# Patient Record
Sex: Female | Born: 1965 | Hispanic: Yes | Marital: Single | Smoking: Never smoker
Health system: Southern US, Community
[De-identification: ages and names within clinical notes are randomized; demographics above are authoritative.]

## PROBLEM LIST (undated history)

## (undated) HISTORY — PX: BREAST CYST ASPIRATION: SHX578

---

## 2012-03-25 ENCOUNTER — Ambulatory Visit: Payer: Self-pay | Admitting: Family Medicine

## 2013-04-07 ENCOUNTER — Ambulatory Visit: Payer: Self-pay

## 2013-05-07 ENCOUNTER — Ambulatory Visit: Payer: Self-pay

## 2014-06-15 ENCOUNTER — Ambulatory Visit: Payer: Self-pay

## 2016-02-12 ENCOUNTER — Encounter: Payer: Self-pay | Admitting: *Deleted

## 2016-02-12 ENCOUNTER — Ambulatory Visit: Payer: Self-pay | Attending: Internal Medicine | Admitting: *Deleted

## 2016-02-12 ENCOUNTER — Ambulatory Visit
Admission: RE | Admit: 2016-02-12 | Discharge: 2016-02-12 | Disposition: A | Discharge status: Home / Self Care | Payer: Self-pay | Source: Ambulatory Visit | Attending: Internal Medicine | Admitting: Internal Medicine

## 2016-02-12 ENCOUNTER — Encounter (INDEPENDENT_AMBULATORY_CARE_PROVIDER_SITE_OTHER): Payer: Self-pay

## 2016-02-12 VITALS — BP 130/80 | HR 73 | Temp 98.7°F | Ht 62.6 in | Wt 181.2 lb

## 2016-02-12 DIAGNOSIS — Z0000 Encounter for general adult medical examination without abnormal findings: Secondary | ICD-10-CM

## 2016-02-12 NOTE — Progress Notes (Signed)
Subjective:     Patient ID: Diana Cohen, female   DOB: 10/17/1965, 50 y.o.   MRN: [id number]  HPI   Review of Systems     Objective:   Physical Exam  Pulmonary/Chest: Right breast exhibits no inverted nipple, no mass, no nipple discharge, no skin change and no tenderness. Left breast exhibits no inverted nipple, no mass, no nipple discharge, no skin change and no tenderness. Breasts are symmetrical.  Bilateral nipples with a transverse slit       Assessment:     50 year old Hispanic [facility].  Diana OrmondSany Cohen the interpreter via language line was used during the interview with Diana Cohen, and Diana Cohen, the [facility] interpreter was present during the exam.  Clinical breast exam unremarkable.  Taught self breast awareness.  Patient just had pap with her PCP, Diana Cohen in August of this year.  Patient has been screened for eligibility.  She does not have any insurance, Medicare or Medicaid.  She also meets financial eligibility.  Hand-out given on the Affordable Care Act.    Plan:     Screening mammogram ordered.   Will follow-up per [facility] protocol.

## 2016-02-12 NOTE — Patient Instructions (Signed)
Gave patient hand-out, Women Staying Healthy, Active and Well from [facility], with education on breast health, pap smears, heart and colon health. 

## 2016-02-13 ENCOUNTER — Encounter: Payer: Self-pay | Admitting: *Deleted

## 2016-02-13 NOTE — Progress Notes (Signed)
Letter mailed to inform patient of her normal mammogram.  She is to follow-up in one year with annual screening.  HSIS to [name]. 

## 2018-03-18 ENCOUNTER — Encounter (INDEPENDENT_AMBULATORY_CARE_PROVIDER_SITE_OTHER): Payer: Self-pay

## 2018-03-18 ENCOUNTER — Ambulatory Visit
Admission: RE | Admit: 2018-03-18 | Discharge: 2018-03-18 | Disposition: A | Discharge status: Home / Self Care | Payer: Self-pay | Source: Ambulatory Visit | Attending: Oncology | Admitting: Oncology

## 2018-03-18 ENCOUNTER — Ambulatory Visit: Payer: Self-pay | Attending: Oncology

## 2018-03-18 VITALS — BP 146/78 | HR 90 | Temp 96.5°F | Resp 18 | Ht 63.0 in | Wt 193.0 lb

## 2018-03-18 DIAGNOSIS — Z0000 Encounter for general adult medical examination without abnormal findings: Secondary | ICD-10-CM

## 2018-03-18 NOTE — Progress Notes (Signed)
  Subjective:     Patient ID: Carolene Gitto, female   DOB: 02/23/1966, 52 y.o.   MRN: [id number]  HPI   Review of Systems     Objective:   Physical Exam  Pulmonary/Chest: Right breast exhibits no inverted nipple, no mass, no nipple discharge, no skin change and no tenderness. Left breast exhibits no inverted nipple, no mass, no nipple discharge, no skin change and no tenderness. Breasts are asymmetrical.  Right breast larger than left.  Transverse nipple inversion       Assessment:     52 year old hispanic patient presents for [facility] clinic visit.  Delos Haring interpreted exam. Patient screened, and meets [facility] eligibility.  Patient does not have insurance, Medicare or Medicaid. Instructed patient on breast self awareness using teach back method.  Clinical breast exam unremarkable. States she has intermittent.  History of right breast cyst aspiration per patient.  Handout given on Affordable Care Act.      Plan:     Sent for bilateral screening mammogram.

## 2018-03-25 NOTE — Progress Notes (Signed)
Letter mailed from [facility] to notify of normal mammogram results.  Patient to return in one year for annual screening.  Copy to HSIS. 

## 2019-09-10 ENCOUNTER — Ambulatory Visit: Payer: Self-pay | Attending: Internal Medicine

## 2020-02-11 ENCOUNTER — Emergency Department: Payer: HRSA Program

## 2020-02-11 ENCOUNTER — Encounter: Payer: Self-pay | Admitting: Emergency Medicine

## 2020-02-11 ENCOUNTER — Emergency Department
Admission: EM | Admit: 2020-02-11 | Discharge: 2020-02-11 | Disposition: A | Discharge status: Home / Self Care | Payer: HRSA Program | Attending: Emergency Medicine | Admitting: Emergency Medicine

## 2020-02-11 ENCOUNTER — Other Ambulatory Visit: Payer: Self-pay

## 2020-02-11 DIAGNOSIS — J189 Pneumonia, unspecified organism: Secondary | ICD-10-CM

## 2020-02-11 DIAGNOSIS — U071 COVID-19: Secondary | ICD-10-CM | POA: Diagnosis not present

## 2020-02-11 DIAGNOSIS — R509 Fever, unspecified: Secondary | ICD-10-CM | POA: Diagnosis present

## 2020-02-11 DIAGNOSIS — Z1152 Encounter for screening for COVID-19: Secondary | ICD-10-CM

## 2020-02-11 DIAGNOSIS — J1282 Pneumonia due to coronavirus disease 2019: Secondary | ICD-10-CM | POA: Insufficient documentation

## 2020-02-11 LAB — SARS CORONAVIRUS 2 BY RT PCR (HOSPITAL ORDER, PERFORMED IN ~~LOC~~ HOSPITAL LAB): SARS Coronavirus 2: POSITIVE — AB

## 2020-02-11 MED ORDER — PREDNISONE 10 MG PO TABS: ORAL_TABLET | ORAL | 0 refills | Status: AC

## 2020-02-11 MED ORDER — ACETAMINOPHEN 500 MG PO TABS
1000.0000 mg | ORAL_TABLET | Freq: Once | ORAL | Status: AC
  Administered 2020-02-11: 1000 mg via ORAL
  Filled 2020-02-11: qty 2

## 2020-02-11 NOTE — ED Triage Notes (Signed)
Says fever for several days and cough.  Not getting better.

## 2020-02-11 NOTE — ED Notes (Signed)
See triage note  Presents with worsening cough and SOB  States she has had negative COIVD at PCP  Then had test repeated on weds

## 2020-02-11 NOTE — ED Provider Notes (Signed)
[facility] Emergency Department Provider Note  ____________________________________________   First MD Initiated Contact with Patient 02/11/20 [phone]     (approximate)  I have reviewed the triage vital signs and the nursing notes.   HISTORY  Chief Complaint Fever Spanish interpreter multiple times to obtain history  HPI Diana Cohen is a 54 y.o. female presents to the ED with complaint of cough that has gotten worse since last week.  Patient reports subjective fever and chills.  She has been taking over-the-counter medication without any relief.  She denies being a smoker.  Patient reports to the interpreter that she had a Covid test done at [facility] which was negative.  No one else in her family is sick at this time.  Patient denies getting Covid vaccine.      History reviewed. No pertinent past medical history.  There are no problems to display for this patient.   History reviewed. No pertinent surgical history.  Prior to Admission medications   Medication Sig Start Date End Date Taking? Authorizing Provider  predniSONE (DELTASONE) 10 MG tablet Take 6 tablets  today, on day 2 take 5 tablets, day 3 take 4 tablets, day 4 take 3 tablets, day 5 take  2 tablets and 1 tablet the last day 02/11/20   Tommi Rumps, PA-C    Allergies Patient has no known allergies.  Family History  Problem Relation Age of Onset   Breast cancer Neg Hx     Social History Social History   Tobacco Use   Smoking status: Never Smoker   Smokeless tobacco: Never Used  Substance Use Topics   Alcohol use: Never   Drug use: Not on file    Review of Systems Constitutional: Subjective fever/chills Eyes: No visual changes. ENT: No sore throat. Cardiovascular: Denies chest pain. Respiratory: Denies shortness of breath. Gastrointestinal: No abdominal pain.  No nausea, no vomiting.  No diarrhea.   Genitourinary: Negative for  dysuria. Musculoskeletal: Positive for generalized muscle aches. Skin: Negative for rash. Neurological: Positive for headache, no focal weakness or numbness.  ____________________________________________   PHYSICAL EXAM:  VITAL SIGNS: ED Triage Vitals  Enc Vitals Group     BP 02/11/20 0744 (!) 160/83     Pulse Rate 02/11/20 0744 85     Resp 02/11/20 0744 20     Temp 02/11/20 0744 98.9 F (37.2 C)     Temp Source 02/11/20 0744 Oral     SpO2 02/11/20 0744 95 %     Weight --      Height --      Head Circumference --      Peak Flow --      Pain Score 02/11/20 0739 0     Pain Loc --      Pain Edu? --      Excl. in GC? --     Constitutional: Alert and oriented. Well appearing and in no acute distress. Eyes: Conjunctivae are normal.  Head: Atraumatic. Nose: No congestion/rhinnorhea. Neck: No stridor.   Cardiovascular: Normal rate, regular rhythm. Grossly normal heart sounds.  Good peripheral circulation. Respiratory: Normal respiratory effort.  No retractions. Lungs CTAB. Gastrointestinal: Soft and nontender. No distention.  Sounds normoactive x4 quadrants. Musculoskeletal: Moves upper and lower extremities with any difficulty.  Normal gait was noted. Neurologic:  Normal speech and language. No gross focal neurologic deficits are appreciated. No gait instability. Skin:  Skin is warm, dry and intact. No rash noted. Psychiatric: Mood and affect  are normal. Speech and behavior are normal.  ____________________________________________   LABS (all labs ordered are listed, but only abnormal results are displayed)  Labs Reviewed  SARS CORONAVIRUS 2 BY RT PCR (HOSPITAL ORDER, PERFORMED IN [facility]) - Abnormal; Notable for the following components:      Result Value   SARS Coronavirus 2 POSITIVE (*)    All other components within normal limits    RADIOLOGY   Official radiology report(s): DG Chest 2 View  Result Date: 02/11/2020 CLINICAL DATA:  Cough,  shortness of breath EXAM: CHEST - 2 VIEW COMPARISON:  02/11/2020 FINDINGS: Multifocal patchy opacities with a subpleural/peripheral and lower lobe predominance, favoring multifocal atypical/viral pneumonia such as COVID. No pleural effusion or pneumothorax. The heart is normal in size. Visualized osseous structures are within normal limits. IMPRESSION: Multifocal pneumonia, favoring an atypical/viral etiology such as COVID. Electronically Signed   By: Charline Bills M.D.   On: 02/11/2020 09:25   DG Chest Port 1 View  Result Date: 02/11/2020 CLINICAL DATA:  Cough for 10 days.  Shortness of breath.  Nonsmoker. EXAM: PORTABLE CHEST 1 VIEW COMPARISON:  None. FINDINGS: Midline trachea. Normal heart size for level of inspiration. No pleural effusion or pneumothorax. Extremely low lung volumes. Subtle patchy basilar opacities are suspected. IMPRESSION: Low lung volume portable radiograph. Patchy basilar opacities are suspected. Although these could represent atelectasis, infection, including COVID-19 pneumonia could have a similar appearance. Recommend PA and lateral radiographs if feasible. Electronically Signed   By: Jeronimo Greaves M.D.   On: 02/11/2020 08:53    ____________________________________________   PROCEDURES  Procedure(s) performed (including Critical Care):  Procedures   ____________________________________________   INITIAL IMPRESSION / ASSESSMENT AND PLAN / ED COURSE  As part of my medical decision making, I reviewed the following data within the electronic MEDICAL RECORD NUMBER Notes from prior ED visits and [place] Controlled Substance Database  Diana Cohen was evaluated in Emergency Department on 02/11/2020 for the symptoms described in the history of present illness. She was evaluated in the context of the global COVID-19 pandemic, which necessitated consideration that the patient might be at risk for infection with the SARS-CoV-2 virus that causes COVID-19. Institutional  protocols and algorithms that pertain to the evaluation of patients at risk for COVID-19 are in a state of rapid change based on information released by regulatory bodies including the CDC and federal and state organizations. These policies and algorithms were followed during the patient's care in the ED.  54 year old female presents to the ED with complaint of cough and shortness of breath.  She states that she has already been seen at [facility] and had a COVID test that reported as negative.  Patient also had a repeat Covid test done 2 days ago and still waiting for the results of that test.  Patient complains of multiple body aches, fever, chills and headache.  Patient has no prior history of smoking.  This is her 12th day of being sick when asked specifically by the interpreter which day she got sick on.  She reports that her symptoms began on August 16.  Chest x-ray shows multifocal pneumonia.  Patient's O2 sat is 96% and patient is stable.  With the interpreter we discussed treatment plans.  Because this is her 12th day of being sick Dr. Delford Field advised that patient is not eligible for the infusion therapy.  Patient was made aware.  She agrees to begin taking prednisone taper.  She is encouraged to ambulate  frequently and to take deep breaths.  She is aware per Spanish interpreter that she is to return to the emergency department immediately if any severe worsening of her breathing or increased shortness of breath.  Prior to discharge the Covid test that was done at [facility] resulted as positive and patient was made aware. ____________________________________________   FINAL CLINICAL IMPRESSION(S) / ED DIAGNOSES  Final diagnoses:  Multifocal pneumonia  Encounter for screening for COVID-19     ED Discharge Orders         Ordered    predniSONE (DELTASONE) 10 MG tablet        02/11/20 1130           Note:  This document was prepared using Dragon voice recognition software and  may include unintentional dictation errors.    Tommi Rumps, PA-C 02/11/20 1420    Arnaldo Natal, MD 02/11/20 [phone]

## 2020-02-11 NOTE — Discharge Instructions (Addendum)
Return immediately to the ED if any worsening of your breathing or shortness of breath. Begin taking the prednisone as directed.

## 2020-02-12 ENCOUNTER — Telehealth: Payer: Self-pay | Admitting: Nurse Practitioner

## 2020-02-12 NOTE — Telephone Encounter (Signed)
Called to discuss with Debbora Lacrosse about Covid symptoms and the use of casirivimab/imdevimab, a combination monoclonal antibody infusion for those with mild to moderate Covid symptoms and at a high risk of hospitalization.     Pt is qualified for this infusion at the [facility] infusion center due to co-morbid conditions (as indicated below) and/or a member of an at-risk group.  Unable to reach. Voicemail left with interpretor service use.   Willette Alma, AGPCNP-BC

## 2020-04-05 ENCOUNTER — Encounter (INDEPENDENT_AMBULATORY_CARE_PROVIDER_SITE_OTHER): Payer: Self-pay

## 2020-04-05 ENCOUNTER — Other Ambulatory Visit: Payer: Self-pay

## 2020-04-05 ENCOUNTER — Ambulatory Visit: Payer: Self-pay | Attending: Oncology

## 2020-04-05 ENCOUNTER — Ambulatory Visit
Admission: RE | Admit: 2020-04-05 | Discharge: 2020-04-05 | Disposition: A | Discharge status: Home / Self Care | Payer: Self-pay | Source: Ambulatory Visit | Attending: Oncology | Admitting: Oncology

## 2020-04-05 VITALS — BP 144/60 | HR 80 | Temp 98.9°F | Ht 63.16 in | Wt 182.1 lb

## 2020-04-05 DIAGNOSIS — Z0000 Encounter for general adult medical examination without abnormal findings: Secondary | ICD-10-CM

## 2020-04-05 NOTE — Progress Notes (Signed)
  Subjective:     Patient ID: Diana Cohen, female   DOB: 02-Oct-1965, 54 y.o.   MRN: [id number]  HPI   Review of Systems     Objective:   Physical Exam Chest:     Breasts:        Right: No swelling, bleeding, inverted nipple, mass, nipple discharge, skin change or tenderness.        Left: No swelling, bleeding, inverted nipple, mass, nipple discharge, skin change or tenderness.        Assessment:     54 year old Hispanic patient presents for [facility] clinic visit.  Patient screened, and meets [facility] eligibility.  Patient does not have insurance, Medicare or Medicaid. Instructed patient on breast self awareness using teach back method. Clinical breast exam unremarkable.  Risk Assessment    Risk Scores      04/05/2020 03/18/2018   Last edited by: Alta Corning, CMA Lenn Sink, RN   5-year risk: 1.1 % 1 %   Lifetime risk: 9.1 % 9.4 %            Plan:     Sent for bilateral screening mammogram.

## 2020-04-10 NOTE — Progress Notes (Signed)
Letter mailed from [facility] to notify of normal mammogram results.  Patient to return in one year for annual screening.  Copy to HSIS. 

## 2021-04-11 ENCOUNTER — Ambulatory Visit
Admission: RE | Admit: 2021-04-11 | Discharge: 2021-04-11 | Disposition: A | Discharge status: Home / Self Care | Payer: Self-pay | Source: Ambulatory Visit | Attending: Oncology | Admitting: Oncology

## 2021-04-11 ENCOUNTER — Other Ambulatory Visit: Payer: Self-pay

## 2021-04-11 ENCOUNTER — Ambulatory Visit: Payer: Self-pay | Attending: Oncology

## 2021-04-11 DIAGNOSIS — Z0000 Encounter for general adult medical examination without abnormal findings: Secondary | ICD-10-CM

## 2021-04-11 NOTE — Progress Notes (Signed)
Patient pre-screened for [facility] eligibility due to COVID 19 precautions. Two patient identifiers used for verification that I was speaking to correct patient.  Patient to Present directly to [facility] today for [facility] screening mammogram.  °

## 2021-04-29 NOTE — Progress Notes (Signed)
Letter mailed from [facility] to notify of normal mammogram results.  Patient to return in one year for annual screening.  Copy to HSIS. 

## 2022-03-22 ENCOUNTER — Other Ambulatory Visit: Payer: Self-pay

## 2022-03-22 DIAGNOSIS — Z1231 Encounter for screening mammogram for malignant neoplasm of breast: Secondary | ICD-10-CM

## 2022-03-26 ENCOUNTER — Ambulatory Visit: Payer: Self-pay

## 2022-05-06 ENCOUNTER — Ambulatory Visit: Payer: Self-pay | Attending: Hematology and Oncology | Admitting: Hematology and Oncology

## 2022-05-06 ENCOUNTER — Other Ambulatory Visit: Payer: Self-pay

## 2022-05-06 ENCOUNTER — Ambulatory Visit
Admission: RE | Admit: 2022-05-06 | Discharge: 2022-05-06 | Disposition: A | Discharge status: Home / Self Care | Payer: Self-pay | Source: Ambulatory Visit | Attending: Obstetrics and Gynecology | Admitting: Obstetrics and Gynecology

## 2022-05-06 VITALS — BP 162/83 | Wt 189.5 lb

## 2022-05-06 DIAGNOSIS — Z1231 Encounter for screening mammogram for malignant neoplasm of breast: Secondary | ICD-10-CM | POA: Insufficient documentation

## 2022-05-06 DIAGNOSIS — Z01419 Encounter for gynecological examination (general) (routine) without abnormal findings: Secondary | ICD-10-CM

## 2022-05-06 DIAGNOSIS — Z124 Encounter for screening for malignant neoplasm of cervix: Secondary | ICD-10-CM

## 2022-05-06 NOTE — Patient Instructions (Signed)
Taught Diana Cohen about self breast awareness. Patient did need a Pap smear today due to last Pap smear was in 2020 per patient. Let her know [facility] will cover Pap smears every 5 years unless has a history of abnormal Pap smears. Referred [facility] to the [facility] for screening mammogram. Appointment scheduled for 05/06/22. Patient aware of appointment and will be there. Let patient know will follow up with her within the next couple weeks with results. Diana Cohen verbalized understanding.  Pascal Lux, NP 2:01 PM

## 2022-05-06 NOTE — Progress Notes (Signed)
[name] is a 56 y.o. No obstetric history on file. female who presents to [facility] clinic today with no complaints.    Pap Smear: Pap smear completed today. Last Pap smear was 2020 at [facility] clinic and was normal. Per patient has no history of an abnormal Pap smear. Last Pap smear result is not available in Epic.   Physical exam: Breasts Breasts symmetrical. No skin abnormalities bilateral breasts. No nipple retraction bilateral breasts. No nipple discharge bilateral breasts. No lymphadenopathy. No lumps palpated bilateral breasts.     MS DIGITAL SCREENING TOMO BILATERAL  Result Date: 04/13/2021 CLINICAL DATA:  Screening. EXAM: DIGITAL SCREENING BILATERAL MAMMOGRAM WITH TOMOSYNTHESIS AND CAD TECHNIQUE: Bilateral screening digital craniocaudal and mediolateral oblique mammograms were obtained. Bilateral screening digital breast tomosynthesis was performed. The images were evaluated with computer-aided detection. COMPARISON:  Previous exam(s). ACR Breast Density Category b: There are scattered areas of fibroglandular density. FINDINGS: There are no findings suspicious for malignancy. IMPRESSION: No mammographic evidence of malignancy. A result letter of this screening mammogram will be mailed directly to the patient. RECOMMENDATION: Screening mammogram in one year. (Code:SM-B-01Y) BI-RADS CATEGORY  1: Negative. Electronically Signed   By: Bary Richard M.D.   On: 04/13/2021 09:36  MS DIGITAL SCREENING TOMO BILATERAL  Result Date: 04/08/2020 CLINICAL DATA:  Screening. EXAM: DIGITAL SCREENING BILATERAL MAMMOGRAM WITH TOMO AND CAD COMPARISON:  Previous exam(s). ACR Breast Density Category b: There are scattered areas of fibroglandular density. FINDINGS: There are no findings suspicious for malignancy. Images were processed with CAD. IMPRESSION: No mammographic evidence of malignancy. A result letter of this screening mammogram will be mailed directly to the patient. RECOMMENDATION:  Screening mammogram in one year. (Code:SM-B-01Y) BI-RADS CATEGORY  1: Negative. Electronically Signed   By: Frederico Hamman M.D.   On: 04/08/2020 08:31   MS DIGITAL SCREENING TOMO BILATERAL  Result Date: 03/19/2018 CLINICAL DATA:  Screening. EXAM: DIGITAL SCREENING BILATERAL MAMMOGRAM WITH TOMO AND CAD COMPARISON:  Previous exam(s). ACR Breast Density Category b: There are scattered areas of fibroglandular density. FINDINGS: There are no findings suspicious for malignancy. Images were processed with CAD. IMPRESSION: No mammographic evidence of malignancy. A result letter of this screening mammogram will be mailed directly to the patient. RECOMMENDATION: Screening mammogram in one year. (Code:SM-B-01Y) BI-RADS CATEGORY  1: Negative. Electronically Signed   By: Norva Pavlov M.D.   On: 03/19/2018 12:10      Pelvic/Bimanual Ext Genitalia No lesions, no swelling and no discharge observed on external genitalia.        Vagina Vagina pink and normal texture. No lesions or discharge observed in vagina.        Cervix Cervix is present. Cervix pink and of normal texture. No discharge observed.    Uterus Uterus is present and palpable. Uterus in normal position and normal size.        Adnexae Bilateral ovaries present and palpable. No tenderness on palpation.         Rectovaginal No rectal exam completed today since patient had no rectal complaints. No skin abnormalities observed on exam.     Smoking History: Patient has never smoked and was not referred to quit line.    Patient Navigation: Patient education provided. Access to services provided for patient through [facility] program. Diana Cohen interpreter provided. No transportation provided   Colorectal Cancer Screening: Per patient has never had colonoscopy completed No complaints today. FIT test given per Diana Cohen.   Breast and Cervical Cancer Risk Assessment: Patient  does not have family history of breast cancer, known  genetic mutations, or radiation treatment to the chest before age 98. Patient does not have history of cervical dysplasia, immunocompromised, or DES exposure in-utero.  Risk Scores as of 05/06/2022     Diana Cohen           5-year 1.72 %   Lifetime 12.4 %   This patient is Hispana/Latina but has no documented birth country, so the [place] model used data from [place] patients to calculate their risk score. Document a birth country in the Demographics activity for a more accurate score.         Last calculated by Narda Rutherford, LPN on 26/37/8588 at  1:46 PM        A: [facility] exam with pap smear No complaint with benign exam.   P: Referred patient to the [facility] for a screening mammogram. Appointment scheduled 05/06/22.  Pascal Lux, NP 05/06/2022 1:59 PM

## 2022-05-08 LAB — CYTOLOGY - PAP
Comment: NEGATIVE
Diagnosis: NEGATIVE
High risk HPV: NEGATIVE

## 2022-05-16 ENCOUNTER — Telehealth: Payer: Self-pay

## 2022-05-16 NOTE — Telephone Encounter (Signed)
Via Delorise Royals, Spanish Interpreter [facility]) Patient informed negative Pap/HPV results, next pap due in 5 years. Patient verbalized understanding.

## 2023-07-16 IMAGING — MG MM DIGITAL SCREENING BILAT W/ TOMO AND CAD
8 series · 8 of 24 positions shown · non-contrast
Comparison: Previous exam(s).

CLINICAL DATA: Screening.

EXAM:
DIGITAL SCREENING BILATERAL MAMMOGRAM WITH TOMOSYNTHESIS AND CAD
TECHNIQUE: Bilateral screening digital craniocaudal and mediolateral oblique
mammograms were obtained. Bilateral screening digital breast
tomosynthesis was performed. The images were evaluated with
computer-aided detection.

[L CC synth-2D]
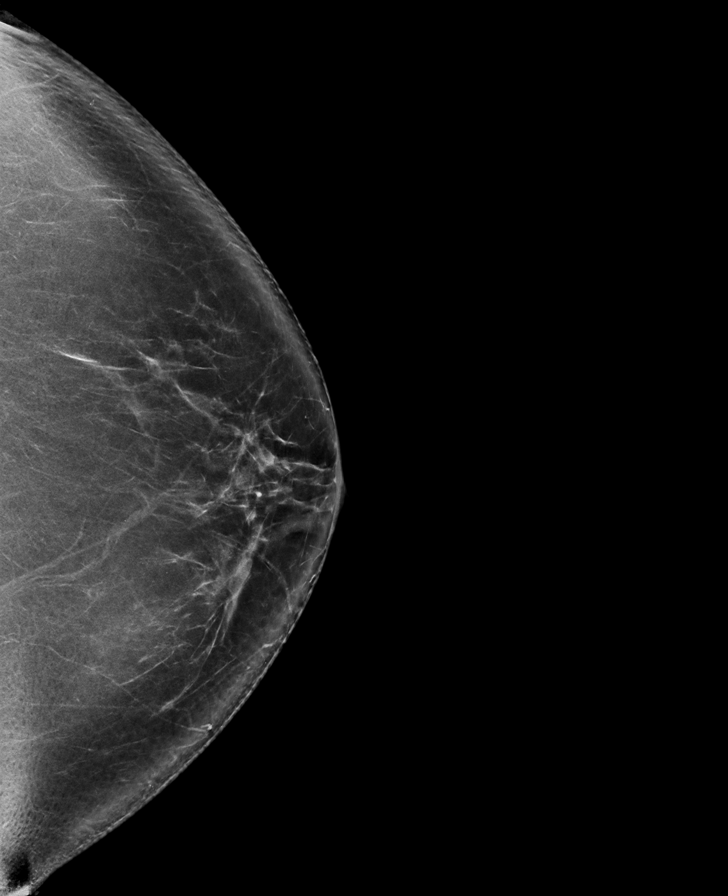

[R MLO synth-2D]
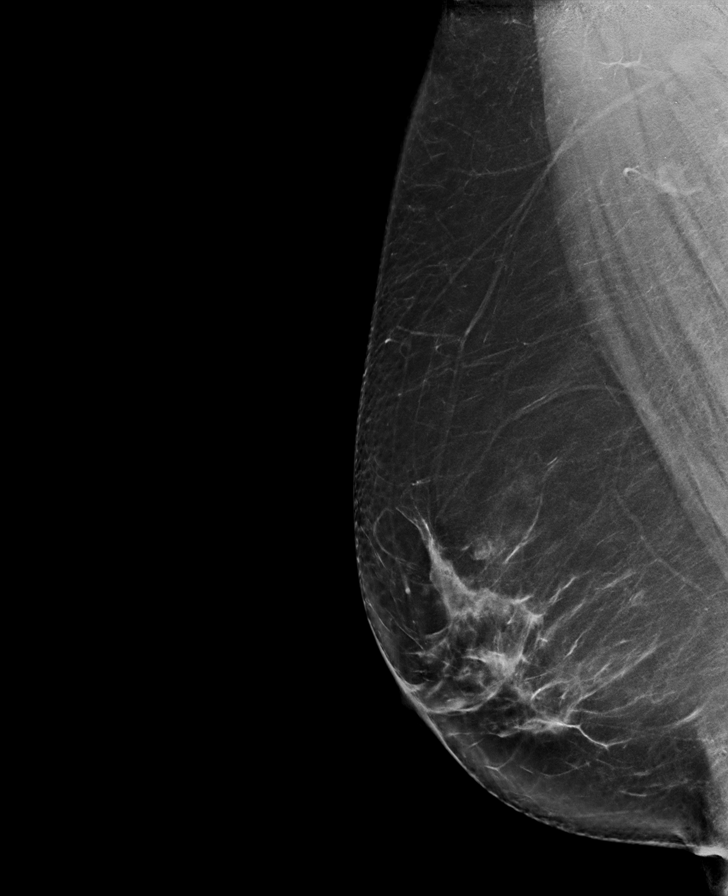

[L MLO synth-2D]
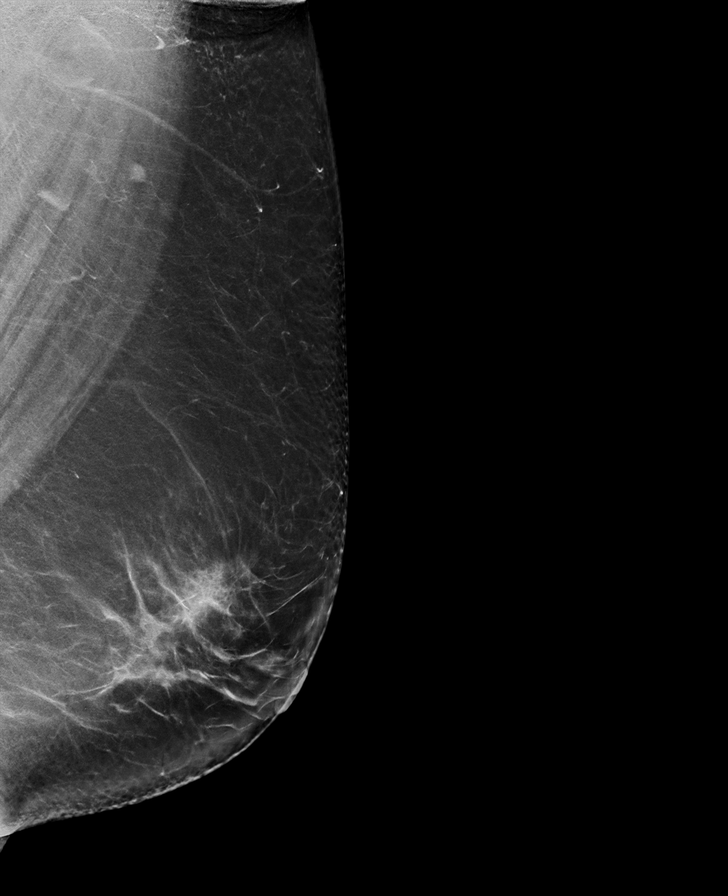

[R CC synth-2D]
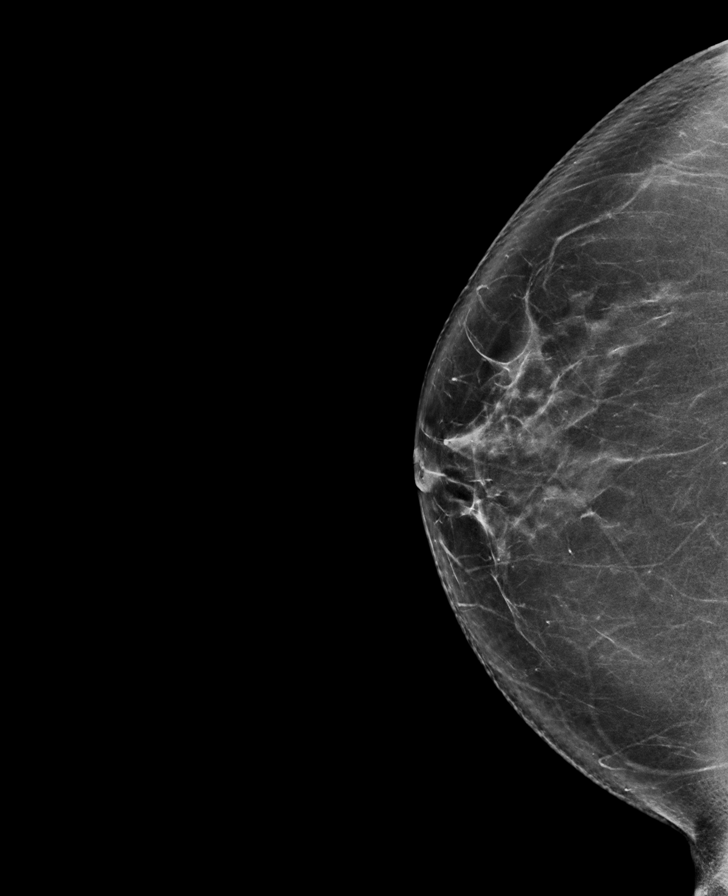

[L MLO tomo · tomo slice 51/100.0]
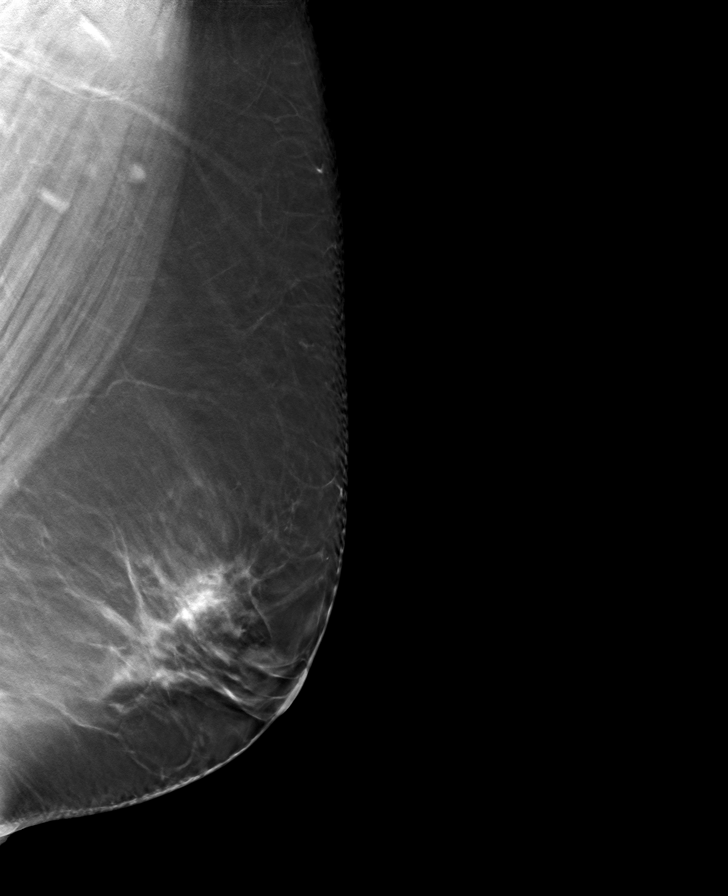

[L CC tomo · tomo slice 48/95.0]
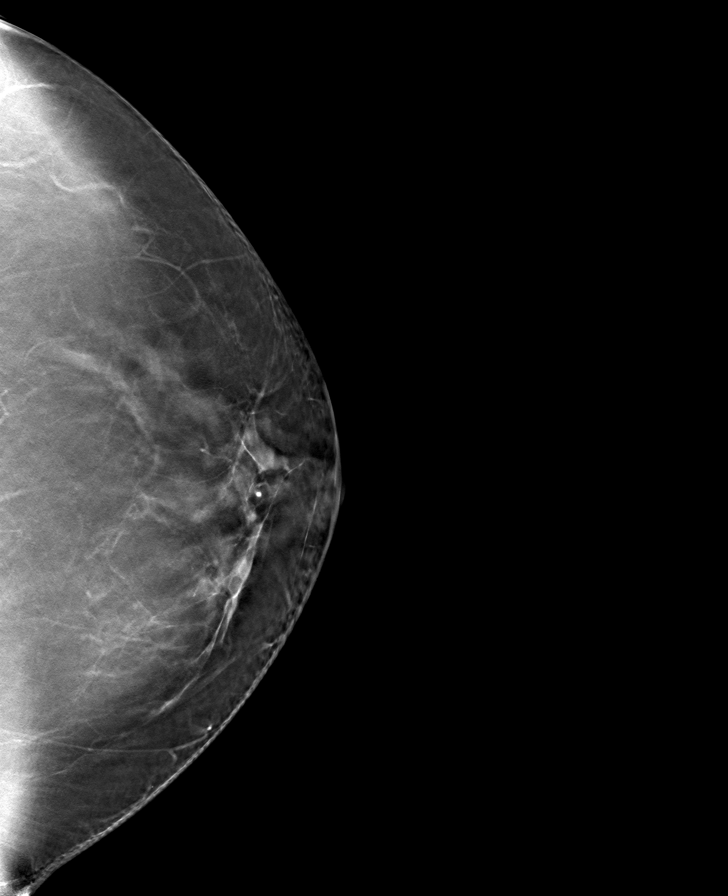

[R MLO tomo · tomo slice 51/100.0]
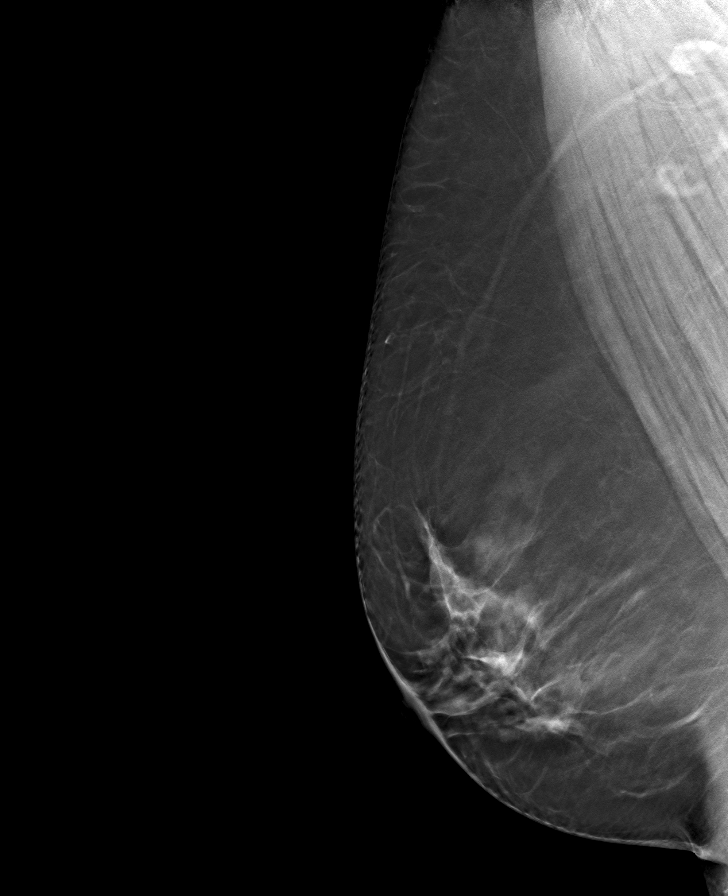

[R CC tomo · tomo slice 46/91.0]
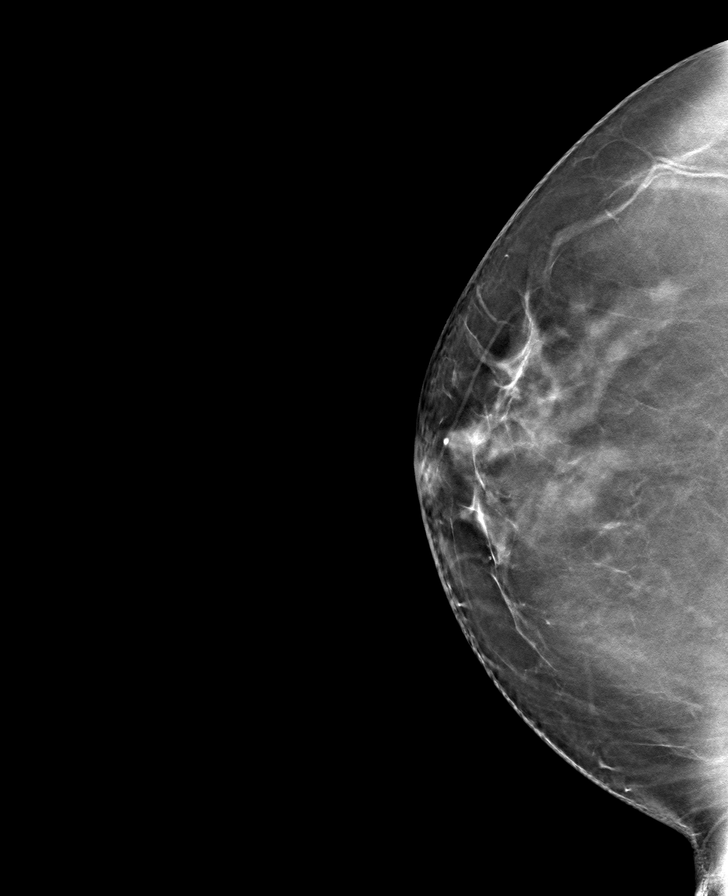

[8 of 24 positions shown; findings below may reference images not displayed]

ACR Breast Density Category b: There are scattered areas of
fibroglandular density.
FINDINGS: There are no findings suspicious for malignancy.
IMPRESSION: No mammographic evidence of malignancy. A result letter of this
screening mammogram will be mailed directly to the patient.

RECOMMENDATION:
Screening mammogram in one year. (Code:51-O-LD2)

BI-RADS CATEGORY  1: Negative.
